# Patient Record
Sex: Female | Born: 2000 | Race: White | Hispanic: No | Marital: Single
Health system: Southern US, Community
[De-identification: ages and names within clinical notes are randomized; demographics above are authoritative.]

## PROBLEM LIST (undated history)

## (undated) DIAGNOSIS — S63642D Sprain of metacarpophalangeal joint of left thumb, subsequent encounter: Secondary | ICD-10-CM

## (undated) DIAGNOSIS — Z30431 Encounter for routine checking of intrauterine contraceptive device: Secondary | ICD-10-CM

## (undated) DIAGNOSIS — M65842 Other synovitis and tenosynovitis, left hand: Secondary | ICD-10-CM

## (undated) DIAGNOSIS — N840 Polyp of corpus uteri: Secondary | ICD-10-CM

---
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
## 2019-02-25 NOTE — Telephone Encounter (Signed)
Love, Cyndia Diver, MD  Jashon Ishida, Drenda Freeze, RN    Caller: Unspecified (Today, ??2:21 PM)       ??       Please reassure her that is normal - she may be amenorrheic with the IUD or she can get occasional irregular bleeding.      This nurse attempted to reach the patient and left a detailed message for the patient regarding MD recommendations and to cal the office back prn

## 2019-02-25 NOTE — Telephone Encounter (Signed)
Call received at 2:35Pm,      18 year old patient last seen in the office on on 04/22/2018.  Patient has mirena iud inserted on 08/2016.    Patient calling to say that she has not had a period or cramping since having the iud.  Patient reports bleeding starting today and it is clot and she has not changed her tampon/pad yet since she just woke up at 12 noon.    Patient report feeing the strings and is not pregnant ( not sex since last office visit)  Patient reports cramping at 4 on the pain scale of 1-10.    Patient wondering how to proceed  Please advise

## 2019-06-13 ENCOUNTER — Encounter: Attending: Obstetrics & Gynecology | Primary: Family Medicine

## 2019-06-13 NOTE — Progress Notes (Deleted)
Annual exam    Bethany Clark is a 19 y.o. presenting for annual well woman exam. Her main concerns today include    She declines / accepts a chaperone during the gynecologic exam today.     Ob/Gyn Hx:  G P  -   LMP -  Menses -   Contraception -  STI -   SA -    Health maintenance:  Gardasil -      Past Medical History:   Diagnosis Date   ??? Abnormal bleeding in menstrual cycle    ??? Encounter for IUD insertion 08/2016    Mirena place by Dr. Kenton Kingfisher   ??? Impetigo 04/21/2018    face   ??? Irregular menses        Past Surgical History:   Procedure Laterality Date   ??? HX DILATION AND CURETTAGE  06/2016   ??? HX HEENT      Wisdom teeth       Family History   Problem Relation Age of Onset   ??? Other Mother         ablation   ??? No Known Problems Father    ??? Diabetes Paternal Grandfather    ??? Pacemaker Maternal Uncle    ??? Cancer Other         leukemia       Social History     Socioeconomic History   ??? Marital status: SINGLE     Spouse name: Not on file   ??? Number of children: Not on file   ??? Years of education: Not on file   ??? Highest education level: Not on file   Occupational History   ??? Not on file   Social Needs   ??? Financial resource strain: Not on file   ??? Food insecurity     Worry: Not on file     Inability: Not on file   ??? Transportation needs     Medical: Not on file     Non-medical: Not on file   Tobacco Use   ??? Smoking status: Never Smoker   ??? Smokeless tobacco: Never Used   Substance and Sexual Activity   ??? Alcohol use: Never     Frequency: Never   ??? Drug use: Never   ??? Sexual activity: Yes     Partners: Male, Female     Birth control/protection: I.U.D.     Comment: Mirena  placed 2018   Lifestyle   ??? Physical activity     Days per week: Not on file     Minutes per session: Not on file   ??? Stress: Not on file   Relationships   ??? Social Wellsite geologist on phone: Not on file     Gets together: Not on file     Attends religious service: Not on file     Active member of club or organization: Not on file      Attends meetings of clubs or organizations: Not on file     Relationship status: Not on file   ??? Intimate partner violence     Fear of current or ex partner: Not on file     Emotionally abused: Not on file     Physically abused: Not on file     Forced sexual activity: Not on file   Other Topics Concern   ??? Not on file   Social History Narrative    ** Merged History Encounter **  Current Outpatient Medications   Medication Sig Dispense Refill   ??? fluconazole (DIFLUCAN) 150 mg tablet Take one tablet now and repeat in one week 2 Tab 2   ??? mupirocin (BACTROBAN) 2 % ointment      ??? oxyCODONE-acetaminophen (PERCOCET) 5-325 mg per tablet Take 1 Tab by mouth every six (6) hours as needed for Pain. Max Daily Amount: 4 Tabs. 15 Tab 0   ??? ibuprofen (MOTRIN) 600 mg tablet Take 1 Tab by mouth every six (6) hours as needed for Pain. (Patient not taking: Reported on 05/24/2018) 40 Tab 2   ??? norethindrone-e estradiol-iron (BLISOVI 24 FE) 1 mg-20 mcg (24)/75 mg (4) tab Take 1 Tab by mouth daily.     ??? ferrous sulfate (SLOW FE) 142 mg (45 mg iron) ER tablet Take 142 mg by mouth two (2) times a day.     ??? B.infantis-B.ani-B.long-B.bifi (PROBIOTIC 4X) 10-15 mg TbEC Take 1 Tab by mouth daily.         No Known Allergies    Review of Systems - History obtained from the patient  Constitutional: negative for weight loss, fever, night sweats  HEENT: negative for hearing loss, earache, congestion, snoring, sorethroat  CV: negative for chest pain, palpitations, edema  Resp: negative for cough, shortness of breath, wheezing  GI: negative for change in bowel habits, abdominal pain, black or bloody stools  GU: negative for frequency, dysuria, hematuria, vaginal discharge  MSK: negative for back pain, joint pain, muscle pain  Breast: negative for breast lumps, nipple discharge, galactorrhea  Skin :negative for itching, rash, hives  Neuro: negative for dizziness, headache, confusion, weakness   Psych: negative for anxiety, depression, change in mood  Heme/lymph: negative for bleeding, bruising, pallor    Physical Exam    There were no vitals taken for this visit.    Constitutional  ?? Appearance: well-nourished, well developed, alert, in no acute distress    HENT  ?? Head and Face: appears normal    Neck  ?? Inspection/Palpation: normal appearance, no masses or tenderness  ?? Lymph Nodes: no lymphadenopathy present  ?? Thyroid: gland size normal, nontender, no nodules or masses present on palpation    Chest  ?? Respiratory Effort: non-labored breathing  ?? Auscultation: CTAB, normal breath sounds    Cardiovascular  ?? Heart:  ?? Auscultation: regular rate and rhythm without murmur  ?? Extremities: no peripheral edema    Gastrointestinal  ?? Abdominal Examination: abdomen non-tender to palpation, normal bowel sounds, no masses present  ?? Liver and spleen: no hepatomegaly present, spleen not palpable  ?? Hernias: no hernias identified    Genitourinary  ?? External Genitalia: normal appearance for age, no discharge present, no tenderness present, no inflammatory lesions present, no masses present, no atrophy present  ?? Vagina: normal vaginal vault without central or paravaginal defects, no discharge present, no inflammatory lesions present, no masses present  ?? Bladder: non-tender to palpation  ?? Urethra: appears normal  ?? Cervix: normal   ?? Uterus: normal size, shape and consistency  ?? Adnexa: no adnexal tenderness present, no adnexal masses present  ?? Perineum: perineum within normal limits, no evidence of trauma, no rashes or skin lesions present    Skin  ?? General Inspection: no rash, no lesions identified    Neurologic/Psychiatric  ?? Mental Status:  ?? Orientation: grossly oriented to person, place and time  ?? Mood and Affect: mood normal, affect appropriate      Assessment/Plan:  19 y.o. presenting for annual  exam. Overall doing well.     Well woman exam:  Normal gynecologic and physical exams.    Healthy habits and lifestyle and safe sex practices reviewed.   Pap indicated at 20yo.  Patient accepts STD screening.  Counseled regarding HPV vaccination.  Contraception and menstrual regulation -        Christoper Allegra, MD

## 2019-07-17 NOTE — Telephone Encounter (Signed)
Call received at 16:58am    19 year old patient last seen in the office on 05/24/2018 and has up coming appointment on 08/08/2019    Patient wondering about needing to replace her iud    Patient was advised that according to her chart notes by Md her iud was inserted March 2018.    Patient was advised IUD placement ok for 5 years    Patient advised to discuss her questions about the IUD replace ment at her next appointment    Patient verbalized understanding.

## 2019-08-08 ENCOUNTER — Ambulatory Visit
Admit: 2019-08-08 | Discharge: 2019-08-08 | Payer: TRICARE (CHAMPUS) | Attending: Obstetrics & Gynecology | Primary: Family Medicine

## 2019-08-08 ENCOUNTER — Ambulatory Visit: Attending: Obstetrics & Gynecology

## 2019-08-08 DIAGNOSIS — Z01419 Encounter for gynecological examination (general) (routine) without abnormal findings: Secondary | ICD-10-CM

## 2019-08-08 MED ORDER — BUSPIRONE 5 MG TAB
5 mg | ORAL_TABLET | Freq: Two times a day (BID) | ORAL | 1 refills | Status: AC
Start: 2019-08-08 — End: ?

## 2019-08-08 NOTE — Patient Instructions (Signed)
Well Visit, Ages 18 to 50: Care Instructions  Your Care Instructions     Physical exams can help you stay healthy. Your doctor has checked your overall health and may have suggested ways to take good care of yourself. He or she also may have recommended tests. At home, you can help prevent illness with healthy eating, regular exercise, and other steps.  Follow-up care is a key part of your treatment and safety. Be sure to make and go to all appointments, and call your doctor if you are having problems. It's also a good idea to know your test results and keep a list of the medicines you take.  How can you care for yourself at home?  ?? Reach and stay at a healthy weight. This will lower your risk for many problems, such as obesity, diabetes, heart disease, and high blood pressure.  ?? Get at least 30 minutes of physical activity on most days of the week. Walking is a good choice. You also may want to do other activities, such as running, swimming, cycling, or playing tennis or team sports. Discuss any changes in your exercise program with your doctor.  ?? Do not smoke or allow others to smoke around you. If you need help quitting, talk to your doctor about stop-smoking programs and medicines. These can increase your chances of quitting for good.  ?? Talk to your doctor about whether you have any risk factors for sexually transmitted infections (STIs). Having one sex partner (who does not have STIs and does not have sex with anyone else) is a good way to avoid these infections.  ?? Use birth control if you do not want to have children at this time. Talk with your doctor about the choices available and what might be best for you.  ?? Protect your skin from too much sun. When you're outdoors from 10 a.m. to 4 p.m., stay in the shade or cover up with clothing and a hat with a wide brim. Wear sunglasses that block UV rays. Even when it's cloudy, put broad-spectrum sunscreen (SPF 30 or higher) on any exposed skin.   ?? See a dentist one or two times a year for checkups and to have your teeth cleaned.  ?? Wear a seat belt in the car.  Follow your doctor's advice about when to have certain tests. These tests can spot problems early.  For everyone  ?? Cholesterol. Have the fat (cholesterol) in your blood tested after age 20. Your doctor will tell you how often to have this done based on your age, family history, or other things that can increase your risk for heart disease.  ?? Blood pressure. Have your blood pressure checked during a routine doctor visit. Your doctor will tell you how often to check your blood pressure based on your age, your blood pressure results, and other factors.  ?? Vision. Talk with your doctor about how often to have a glaucoma test.  ?? Diabetes. Ask your doctor whether you should have tests for diabetes.  ?? Colon cancer. Your risk for colorectal cancer gets higher as you get older. Some experts say that adults should start regular screening at age 50 and stop at age 75. Others say to start before age 50 or continue after age 75. Talk with your doctor about your risk and when to start and stop screening.  For women  ?? Breast exam and mammogram. Talk to your doctor about when you should have a clinical breast exam and   a mammogram. Medical experts differ on whether and how often women under 50 should have these tests. Your doctor can help you decide what is right for you.  ?? Cervical cancer screening test and pelvic exam. Begin with a Pap test at age 21. The test often is part of a pelvic exam. Starting at age 30, you may choose to have a Pap test, an HPV test, or both tests at the same time (called co-testing). Talk with your doctor about how often to have testing.  ?? Tests for sexually transmitted infections (STIs). Ask whether you should have tests for STIs. You may be at risk if you have sex with more than one person, especially if your partners do not wear condoms.  For men   ?? Tests for sexually transmitted infections (STIs). Ask whether you should have tests for STIs. You may be at risk if you have sex with more than one person, especially if you do not wear a condom.  ?? Testicular cancer exam. Ask your doctor whether you should check your testicles regularly.  ?? Prostate exam. Talk to your doctor about whether you should have a blood test (called a PSA test) for prostate cancer. Experts differ on whether and when men should have this test. Some experts suggest it if you are older than 45 and are African-American or have a father or brother who got prostate cancer when he was younger than 65.  When should you call for help?  Watch closely for changes in your health, and be sure to contact your doctor if you have any problems or symptoms that concern you.  Where can you learn more?  Go to https://www.healthwise.net/[facility]  Enter P072 in the search box to learn more about "Well Visit, Ages 18 to 50: Care Instructions."  Current as of: Oct 30, 2018??????????????????????????????Content Version: 12.6  ?? 2006-2020 Healthwise[employer].   Care instructions adapted under license by [employer] (which disclaims liability or warranty for this information). If you have questions about a medical condition or this instruction, always ask your healthcare professional. Healthwise, Incorporated disclaims any warranty or liability for your use of this information.

## 2019-08-08 NOTE — Progress Notes (Signed)
Annual exam    Bethany Clark is a 19 y.o. presenting for annual well woman exam. Her main concerns today include recurrent yeast infections and concerns about her hormone levels.    She feels like she is depressed and is having anxiety attacks.  She tried seeing a therapist through student health at [employer], but felt there therapist was judgmental and did not go back. She does not have a car at college this year, and so has not been able to see a provider outside of campus.     She reports having multiple yeast infections over the past year, states none of the medications or treatments she has tried have been effective. She is also concerned that her Mirena may be causing problems with her hormone levels. She has not had the yeast formally diagnosed.     She reports having low energy, feeling tired all the time. Of note, she did have Covid last August shortly after returning to college.     She attends [employer].    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     She declines a chaperone during the gynecologic exam today.     Ob/Gyn Hx:  G0  LMP - n/a  Menses - none w/ Mirena  Contraception - Mirena placed March 2018  STI - last screening negative 04/22/18  SA - not currently    Health maintenance:  Gardasil -      Past Medical History:   Diagnosis Date   ??? Abnormal bleeding in menstrual cycle    ??? Encounter for IUD insertion 08/2016    Mirena place by Dr. Ronnell Freshwater   ??? Impetigo 04/21/2018    face   ??? Irregular menses        Past Surgical History:   Procedure Laterality Date   ??? HX DILATION AND CURETTAGE  06/2016   ??? HX HEENT      Wisdom teeth   ??? HX ORTHOPAEDIC         Family History   Problem Relation Age of Onset   ??? Other Mother         ablation   ??? No Known Problems Father    ??? Diabetes Paternal Grandfather    ??? Pacemaker Maternal Uncle    ??? Cancer Other         leukemia       Social History     Socioeconomic History    ??? Marital status: SINGLE     Spouse name: Not on file   ??? Number of children: Not on file   ??? Years of education: Not on file   ??? Highest education level: Not on file   Occupational History   ??? Not on file   Social Needs   ??? Financial resource strain: Not on file   ??? Food insecurity     Worry: Not on file     Inability: Not on file   ??? Transportation needs     Medical: Not on file     Non-medical: Not on file   Tobacco Use   ??? Smoking status: Never Smoker   ??? Smokeless tobacco: Never Used   Substance and Sexual Activity   ??? Alcohol use: Never     Frequency: Never   ??? Drug use: Never   ??? Sexual activity: Not Currently     Partners: Male, Female     Birth control/protection: I.U.D.  Comment: Mirena  placed 2018   Lifestyle   ??? Physical activity     Days per week: Not on file     Minutes per session: Not on file   ??? Stress: Not on file   Relationships   ??? Social Wellsite geologist on phone: Not on file     Gets together: Not on file     Attends religious service: Not on file     Active member of club or organization: Not on file     Attends meetings of clubs or organizations: Not on file     Relationship status: Not on file   ??? Intimate partner violence     Fear of current or ex partner: Not on file     Emotionally abused: Not on file     Physically abused: Not on file     Forced sexual activity: Not on file   Other Topics Concern   ??? Not on file   Social History Narrative    ** Merged History Encounter **            Current Outpatient Medications   Medication Sig Dispense Refill   ??? ibuprofen (MOTRIN) 600 mg tablet Take 1 Tab by mouth every six (6) hours as needed for Pain. (Patient not taking: Reported on 05/24/2018) 40 Tab 2       No Known Allergies    Review of Systems - History obtained from the patient  Constitutional: negative for weight loss, fever, night sweats  HEENT: negative for hearing loss, earache, congestion, snoring, sorethroat  CV: negative for chest pain, palpitations, edema   Resp: negative for cough, shortness of breath, wheezing  GI: negative for change in bowel habits, abdominal pain, black or bloody stools  GU: negative for frequency, dysuria, hematuria, vaginal discharge  MSK: negative for back pain, joint pain, muscle pain  Breast: negative for breast lumps, nipple discharge, galactorrhea  Skin :negative for itching, rash, hives  Neuro: negative for dizziness, headache, confusion, weakness  Psych: negative for anxiety, depression, change in mood  Heme/lymph: negative for bleeding, bruising, pallor    Physical Exam    Visit Vitals  BP 130/84 (BP 1 Location: Left arm, BP Patient Position: Sitting)   Ht 6' (1.829 m)   Wt 180 lb (81.6 kg)   BMI 24.41 kg/m??       Constitutional  ?? Appearance: well-nourished, well developed, alert, in no acute distress    HENT  ?? Head and Face: appears normal    Neck  ?? Inspection/Palpation: normal appearance, no masses or tenderness  ?? Lymph Nodes: no lymphadenopathy present  ?? Thyroid: gland size normal, nontender, no nodules or masses present on palpation    Chest  ?? Respiratory Effort: non-labored breathing  ?? Auscultation: CTAB, normal breath sounds    Cardiovascular  ?? Heart:  ?? Auscultation: regular rate and rhythm without murmur  ?? Extremities: no peripheral edema    Gastrointestinal  ?? Abdominal Examination: abdomen non-tender to palpation, normal bowel sounds, no masses present  ?? Liver and spleen: no hepatomegaly present, spleen not palpable  ?? Hernias: no hernias identified    Genitourinary  ?? External Genitalia: normal appearance for age, no discharge present, no tenderness present, no inflammatory lesions present, no masses present, no atrophy present  ?? Vagina: normal vaginal vault without central or paravaginal defects, no discharge present, no inflammatory lesions present, no masses present  ?? Bladder: non-tender to palpation  ?? Urethra: appears  normal  ?? Cervix: normal IUD strings 2cm  ?? Uterus: normal size, shape and consistency   ?? Adnexa: no adnexal tenderness present, no adnexal masses present  ?? Perineum: perineum within normal limits, no evidence of trauma, no rashes or skin lesions present    Skin  ?? General Inspection: no rash, no lesions identified    Neurologic/Psychiatric  ?? Mental Status:  ?? Orientation: grossly oriented to person, place and time  ?? Mood and Affect: mood normal, affect appropriate      Assessment/Plan:  19 y.o. presenting for annual exam. Overall doing well.     Well woman exam:  Normal gynecologic and physical exams.   Healthy habits and lifestyle and safe sex practices reviewed.   Pap indicated at 20yo.  Full nuswab including STD screening, BV, snc 6 species yeast given her recurrent vaginal symptoms with no improvement with management options thus far.  Contraception and menstrual regulation -  Cont Mirena IUD.    Anxiety/depression - discussed at length, recommended seeing a mental health care professional and names given. Discussed med options, and she accepts trial of buspar. Rx buspar sent.       Christoper Allegra, MD

## 2019-08-08 NOTE — Progress Notes (Signed)
Left message requesting patient call office to review results of her recent lab tests.

## 2019-08-08 NOTE — Progress Notes (Signed)
Please call pt and let her know that her pelvic swab was positive for candida albicans, but negative for everything else. This is the most common type of yeast, and is often a yeast that women are colonized with.   If she is having itching or irritation, please send in rx for terconazole 7 day for her.

## 2019-08-08 NOTE — Progress Notes (Signed)
Please call pt and let her know that her pelvic swab was positive for candida albicans, but negative for everything else. This is the most common type of yeast, and is often a yeast that women are colonized with.   If she is having itching or irritation, please send in rx for terconazole 7 day for her.

## 2019-08-08 NOTE — Progress Notes (Signed)
Annual exam    Bethany Clark is a 19 y.o. presenting for annual well woman exam. Her main concerns today include recurrent yeast infections and concerns about her hormone levels.    She feels like she is depressed and is having anxiety attacks.  She tried seeing a therapist through student health at [place], but felt there therapist was judgmental and did not go back. She does not have a car at college this year, and so has not been able to see a provider outside of campus.     She reports having multiple yeast infections over the past year, states none of the medications or treatments she has tried have been effective. She is also concerned that her Mirena may be causing problems with her hormone levels. She has not had the yeast formally diagnosed.     She reports having low energy, feeling tired all the time. Of note, she did have Covid last August shortly after returning to college.     She attends [employer].    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     She declines a chaperone during the gynecologic exam today.     Ob/Gyn Hx:  G0  LMP - n/a  Menses - none w/ Mirena  Contraception - Mirena placed March 2018  STI - last screening negative 04/22/18  SA - not currently    Health maintenance:  Gardasil -      Past Medical History:   Diagnosis Date   ??? Abnormal bleeding in menstrual cycle    ??? Encounter for IUD insertion 08/2016    Mirena place by Dr. Kenton Kingfisher   ??? Impetigo 04/21/2018    face   ??? Irregular menses        Past Surgical History:   Procedure Laterality Date   ??? HX DILATION AND CURETTAGE  06/2016   ??? HX HEENT      Wisdom teeth   ??? HX ORTHOPAEDIC         Family History   Problem Relation Age of Onset   ??? Other Mother         ablation   ??? No Known Problems Father    ??? Diabetes Paternal Grandfather    ??? Pacemaker Maternal Uncle    ??? Cancer Other         leukemia       Social History     Socioeconomic History   ???  Marital status: SINGLE     Spouse name: Not on file   ??? Number of children: Not on file   ??? Years of education: Not on file   ??? Highest education level: Not on file   Occupational History   ??? Not on file   Social Needs   ??? Financial resource strain: Not on file   ??? Food insecurity     Worry: Not on file     Inability: Not on file   ??? Transportation needs     Medical: Not on file     Non-medical: Not on file   Tobacco Use   ??? Smoking status: Never Smoker   ??? Smokeless tobacco: Never Used   Substance and Sexual Activity   ??? Alcohol use: Never     Frequency: Never   ??? Drug use: Never   ??? Sexual activity: Not Currently     Partners: Male, Female     Birth control/protection: I.U.D.  Comment: Mirena  placed 2018   Lifestyle   ??? Physical activity     Days per week: Not on file     Minutes per session: Not on file   ??? Stress: Not on file   Relationships   ??? Social Wellsite geologist on phone: Not on file     Gets together: Not on file     Attends religious service: Not on file     Active member of club or organization: Not on file     Attends meetings of clubs or organizations: Not on file     Relationship status: Not on file   ??? Intimate partner violence     Fear of current or ex partner: Not on file     Emotionally abused: Not on file     Physically abused: Not on file     Forced sexual activity: Not on file   Other Topics Concern   ??? Not on file   Social History Narrative    ** Merged History Encounter **            Current Outpatient Medications   Medication Sig Dispense Refill   ??? ibuprofen (MOTRIN) 600 mg tablet Take 1 Tab by mouth every six (6) hours as needed for Pain. (Patient not taking: Reported on 05/24/2018) 40 Tab 2       No Known Allergies    Review of Systems - History obtained from the patient  Constitutional: negative for weight loss, fever, night sweats  HEENT: negative for hearing loss, earache, congestion, snoring, sorethroat  CV: negative for chest pain, palpitations, edema  Resp: negative for  cough, shortness of breath, wheezing  GI: negative for change in bowel habits, abdominal pain, black or bloody stools  GU: negative for frequency, dysuria, hematuria, vaginal discharge  MSK: negative for back pain, joint pain, muscle pain  Breast: negative for breast lumps, nipple discharge, galactorrhea  Skin :negative for itching, rash, hives  Neuro: negative for dizziness, headache, confusion, weakness  Psych: negative for anxiety, depression, change in mood  Heme/lymph: negative for bleeding, bruising, pallor    Physical Exam    Visit Vitals  BP 130/84 (BP 1 Location: Left arm, BP Patient Position: Sitting)   Ht 6' (1.829 m)   Wt 180 lb (81.6 kg)   BMI 24.41 kg/m??       Constitutional  ?? Appearance: well-nourished, well developed, alert, in no acute distress    HENT  ?? Head and Face: appears normal    Neck  ?? Inspection/Palpation: normal appearance, no masses or tenderness  ?? Lymph Nodes: no lymphadenopathy present  ?? Thyroid: gland size normal, nontender, no nodules or masses present on palpation    Chest  ?? Respiratory Effort: non-labored breathing  ?? Auscultation: CTAB, normal breath sounds    Cardiovascular  ?? Heart:  ?? Auscultation: regular rate and rhythm without murmur  ?? Extremities: no peripheral edema    Gastrointestinal  ?? Abdominal Examination: abdomen non-tender to palpation, normal bowel sounds, no masses present  ?? Liver and spleen: no hepatomegaly present, spleen not palpable  ?? Hernias: no hernias identified    Genitourinary  ?? External Genitalia: normal appearance for age, no discharge present, no tenderness present, no inflammatory lesions present, no masses present, no atrophy present  ?? Vagina: normal vaginal vault without central or paravaginal defects, no discharge present, no inflammatory lesions present, no masses present  ?? Bladder: non-tender to palpation  ?? Urethra: appears  normal  ?? Cervix: normal IUD strings 2cm  ?? Uterus: normal size, shape and consistency  ?? Adnexa: no adnexal  tenderness present, no adnexal masses present  ?? Perineum: perineum within normal limits, no evidence of trauma, no rashes or skin lesions present    Skin  ?? General Inspection: no rash, no lesions identified    Neurologic/Psychiatric  ?? Mental Status:  ?? Orientation: grossly oriented to person, place and time  ?? Mood and Affect: mood normal, affect appropriate      Assessment/Plan:  19 y.o. presenting for annual exam. Overall doing well.     Well woman exam:  Normal gynecologic and physical exams.   Healthy habits and lifestyle and safe sex practices reviewed.   Pap indicated at 20yo.  Full nuswab including STD screening, BV, snc 6 species yeast given her recurrent vaginal symptoms with no improvement with management options thus far.  Contraception and menstrual regulation -  Cont Mirena IUD.    Anxiety/depression - discussed at length, recommended seeing a mental health care professional and names given. Discussed med options, and she accepts trial of buspar. Rx buspar sent.       Gevena Cotton, MD

## 2019-08-08 NOTE — Progress Notes (Signed)
Left message requesting patient call office to review results of her recent lab tests.

## 2019-08-12 LAB — NUSWAB VAGINITIS PLUS (VG+) WITH CANDIDA (SIX SPECIES)
C PARAPSILOSIS/TROPICALIS: NEGATIVE
C PARAPSILOSIS/TROPICALIS: NEGATIVE
C. albicans, NAA: POSITIVE — AB
C. glabrata, NAA: NEGATIVE
C. trachomatis, NAA: NEGATIVE
CANDIDA KRUSEI, NAA, 180016: NEGATIVE
Candida Lusitaniae, NAA: NEGATIVE
Candida albicans, NAA: POSITIVE — AB
Candida glabrata by PCR: NEGATIVE
Candida krusei: NEGATIVE
Candida lusitaniae: NEGATIVE
Chlamydia trachomatis, NAA: NEGATIVE
N. gonorrhoeae, NAA: NEGATIVE
Neisseria Gonorrhoeae, NAA: NEGATIVE
T. vaginalis, NAA: NEGATIVE
Trichomonas Vaginalis by NAA: NEGATIVE

## 2019-08-12 LAB — SPECIMEN STATUS REPORT

## 2019-08-14 MED ORDER — TERCONAZOLE 0.4 % VAGINAL CREAM
0.4 % | Freq: Every evening | VAGINAL | 0 refills | Status: AC
Start: 2019-08-14 — End: 2019-08-21

## 2019-08-14 NOTE — Telephone Encounter (Signed)
Patient is calling for lab results:        Please call pt and let her know that her pelvic swab was positive for candida albicans, but negative for everything else. This is the most common type of yeast, and is often a yeast that women are colonized with.   If she is having itching or irritation, please send in rx for terconazole 7 day for her    Patient request change of pharmacy:      [facility] [address]  [place], [place] [address]    [phone]

## 2019-08-14 NOTE — Telephone Encounter (Signed)
rx has been sent for Terazol 7.

## 2019-09-08 ENCOUNTER — Encounter: Attending: Obstetrics & Gynecology | Primary: Family Medicine

## 2019-09-11 ENCOUNTER — Ambulatory Visit: Payer: TRICARE (CHAMPUS) | Attending: Obstetrics & Gynecology | Primary: Family Medicine

## 2019-10-02 ENCOUNTER — Encounter: Payer: TRICARE (CHAMPUS) | Attending: Obstetrics & Gynecology | Primary: Family Medicine

## 2019-10-02 MED ORDER — TERCONAZOLE 0.4 % VAGINAL CREAM
0.4 % | Freq: Every evening | VAGINAL | 1 refills | Status: AC
Start: 2019-10-02 — End: 2019-10-09

## 2019-10-02 NOTE — Progress Notes (Deleted)
Problem Visit    Bethany Clark is a 19 y.o.  presenting for problem visit. Her main concern today is follow up for anxiety. At her annual exam in March she was started on a trial of Buspar 5mg  for anxiety/depression.      Ob/Gyn Hx:  G0  LMP - n/a  Menses - none w/ Mirena  Contraception - Mirena placed March 2018  SA - not currently      Past Medical History:   Diagnosis Date   ??? Abnormal bleeding in menstrual cycle    ??? Encounter for IUD insertion 08/2016    Mirena place by Dr. Ronnell Freshwater   ??? Impetigo 04/21/2018    face   ??? Irregular menses        Past Surgical History:   Procedure Laterality Date   ??? HX DILATION AND CURETTAGE  06/2016   ??? HX HEENT      Wisdom teeth   ??? HX ORTHOPAEDIC         Family History   Problem Relation Age of Onset   ??? Other Mother         ablation   ??? No Known Problems Father    ??? Diabetes Paternal Grandfather    ??? Pacemaker Maternal Uncle    ??? Cancer Other         leukemia       Social History     Socioeconomic History   ??? Marital status: SINGLE     Spouse name: Not on file   ??? Number of children: Not on file   ??? Years of education: Not on file   ??? Highest education level: Not on file   Occupational History   ??? Not on file   Social Needs   ??? Financial resource strain: Not on file   ??? Food insecurity     Worry: Not on file     Inability: Not on file   ??? Transportation needs     Medical: Not on file     Non-medical: Not on file   Tobacco Use   ??? Smoking status: Never Smoker   ??? Smokeless tobacco: Never Used   Substance and Sexual Activity   ??? Alcohol use: Never     Frequency: Never   ??? Drug use: Never   ??? Sexual activity: Not Currently     Partners: Male, Female     Birth control/protection: I.U.D.     Comment: Mirena  placed 2018   Lifestyle   ??? Physical activity     Days per week: Not on file     Minutes per session: Not on file   ??? Stress: Not on file   Relationships   ??? Social Product manager on phone: Not on file     Gets together: Not on file     Attends religious service: Not on  file     Active member of club or organization: Not on file     Attends meetings of clubs or organizations: Not on file     Relationship status: Not on file   ??? Intimate partner violence     Fear of current or ex partner: Not on file     Emotionally abused: Not on file     Physically abused: Not on file     Forced sexual activity: Not on file   Other Topics Concern   ??? Not on file   Social History Narrative    **  Merged History Encounter **            Current Outpatient Medications   Medication Sig Dispense Refill   ??? busPIRone (BUSPAR) 5 mg tablet Take 1 Tab by mouth two (2) times a day. 60 Tab 1   ??? ibuprofen (MOTRIN) 600 mg tablet Take 1 Tab by mouth every six (6) hours as needed for Pain. (Patient not taking: Reported on 05/24/2018) 40 Tab 2       No Known Allergies    Review of Systems - History obtained from the patient  Constitutional: negative for weight loss, fever, night sweats  HEENT: negative for hearing loss, earache, congestion, snoring, sorethroat  CV: negative for chest pain, palpitations, edema  Resp: negative for cough, shortness of breath, wheezing  GI: negative for change in bowel habits, abdominal pain, black or bloody stools  GU: negative for frequency, dysuria, hematuria, vaginal discharge  MSK: negative for back pain, joint pain, muscle pain  Breast: negative for breast lumps, nipple discharge, galactorrhea  Skin :negative for itching, rash, hives  Neuro: negative for dizziness, headache, confusion, weakness  Psych: negative for anxiety, depression, change in mood  Heme/lymph: negative for bleeding, bruising, pallor    Physical Exam    There were no vitals taken for this visit.      OBGyn Exam      Constitutional  ?? Appearance: well-nourished, well developed, alert, in no acute distress    HENT  ?? Head and Face: appears normal    Neck  ?? Inspection/Palpation: normal appearance, no masses or tenderness  ?? Thyroid: gland size normal, nontender    Chest  ?? Respiratory Effort: non-labored  breathing    Cardiovascular  ?? Extremities: no peripheral edema    Gastrointestinal  ?? Abdominal Examination: abdomen non-distended, non-tender to palpation, no masses present  ?? Liver and spleen: no hepatomegaly present, spleen not palpable  ?? Hernias: no hernias identified    Genitourinary  ?? External Genitalia: normal appearance for age, no discharge present, no tenderness present, no inflammatory lesions present, no masses present, no atrophy present  ?? Vagina: normal vaginal vault without central or paravaginal defects, no discharge present, no inflammatory lesions present, no masses present  ?? Bladder: non-tender to palpation  ?? Urethra: appears normal  ?? Cervix: normal   ?? Uterus: normal size, shape and consistency  ?? Adnexa: no adnexal tenderness present, no adnexal masses present  ?? Perineum: perineum within normal limits, no evidence of trauma, no rashes or skin lesions present    Skin  ?? General Inspection: no rash, no lesions identified    Neurologic/Psychiatric  ?? Mental Status:  ?? Orientation: grossly oriented to person, place and time  ?? Mood and Affect: mood normal, affect appropriate      Assessment/Plan:    There are no diagnoses linked to this encounter.        Christoper Allegra, MD

## 2019-10-02 NOTE — Telephone Encounter (Signed)
Love, Cyndia Diver, MD  Arn Medal, LPN   Caller: Unspecified (Today, ??1:48 PM)      ??      No problem, will you send in rx for terazole 7 with 1 refill to her pharmacy?    Previous Messages

## 2019-10-02 NOTE — Telephone Encounter (Signed)
Patient of SP    Calling to say that she has been getting recurrent yeast infections for about 1 year now.  She said she uses Monistat 7 and it does not help.  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  She said it went away and has come back.      She had an appointment for today set up and had car trouble.  She can not make it this week.  Would like to see if RL would send her in something.  She said she still has a yeasty smell in vaginal area, itching, and burning/itch.  No concerns of STD's per patient.      [facility] [address].

## 2019-10-02 NOTE — Telephone Encounter (Signed)
rx has been sent and patient advised.

## 2019-11-12 ENCOUNTER — Ambulatory Visit: Attending: Obstetrics & Gynecology | Primary: Family Medicine

## 2019-12-13 IMAGING — DX DG TOE GREAT 2+V*R*
3 series · 3 of 3 positions shown · non-contrast
Comparison: None.

CLINICAL DATA: Injured great toe playing basketball

EXAM:
RIGHT GREAT TOE

[toe ap]
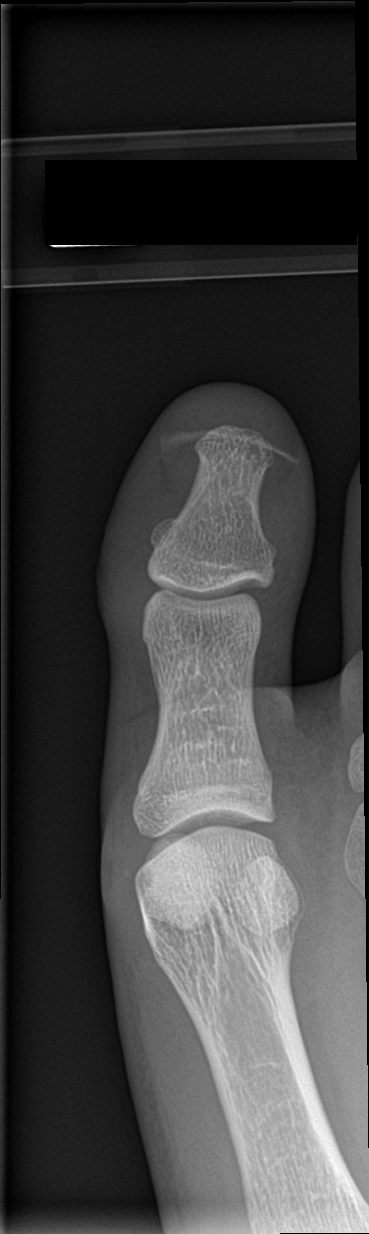

[toe obl]
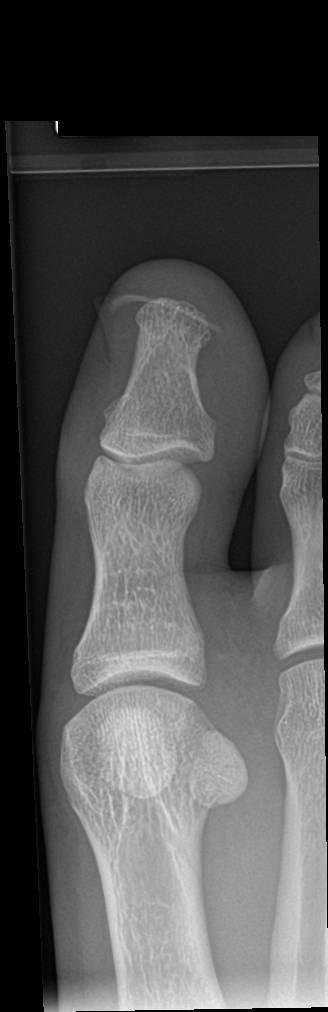

[toe lat]
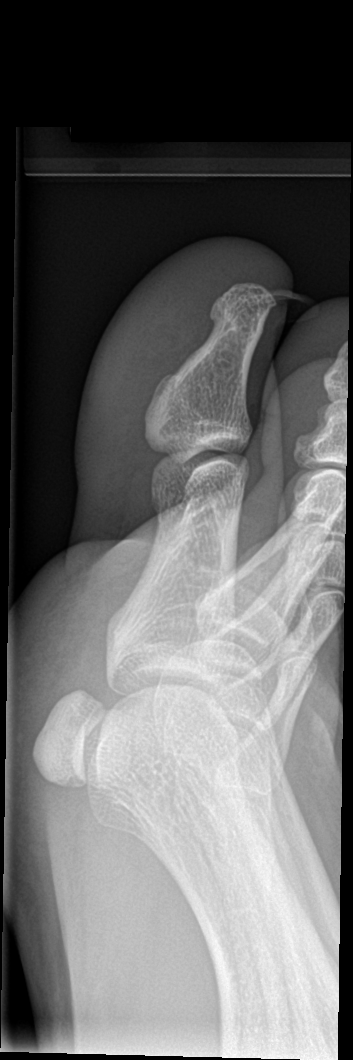

[3 of 3 positions shown; findings below may reference images not displayed]

FINDINGS: There is no evidence of fracture or dislocation. There is no
evidence of arthropathy or other focal bone abnormality. Soft
tissues are unremarkable.
IMPRESSION: Negative.

## 2020-06-23 NOTE — Telephone Encounter (Signed)
Call received at 12:70nn      20 year old patient last seen in the office on 08/08/2019    Patient has iud inserted 3 /2018 by Dr Talbert Cage      Patient calling to say that she can not feel the strings any more     Patient denies vaginal bleeding or cramping    Patient has appointment on 07/16/2020 for iud check but is wondering if she need ultrasound to check placement    Patient is concerned because now she is using for birth control and wants to make sure it is working    Please advise    Thank you    RL patient

## 2020-06-23 NOTE — Telephone Encounter (Signed)
Telephone Encounter by Raleigh Lions, RN at 06/23/20 1332                Author: Abbey Chatters, Drenda Freeze, RN  Service: --  Author Type: Registered Nurse       Filed: 06/23/20 1348  Encounter Date: 06/23/2020  Status: Signed          Editor: Smeltzer, Drenda Freeze, RN (Registered Nurse)               ice Only (iud)        Rob Bunting, MD    You 15 minutes ago (1:16 PM)        AH        Please add [place] to 2/11 appointment; thanks; can use condoms til appt         Patient was advised of MD recommendations and patient was rescheduled to include ultrasound      Patient was placed on the schedule on 07/23/2020 at 12:30PM ( arrive at 12:15Pm)and then 1;10PM to see provider         Patient verbalized understanding.

## 2020-07-16 ENCOUNTER — Encounter: Attending: Advanced Practice Midwife | Primary: Family Medicine

## 2020-07-23 ENCOUNTER — Encounter: Attending: Obstetrics & Gynecology | Primary: Family Medicine

## 2020-07-23 ENCOUNTER — Encounter

## 2020-07-23 ENCOUNTER — Encounter: Admit: 2020-07-23 | Payer: TRICARE (CHAMPUS) | Primary: Family Medicine

## 2020-07-23 ENCOUNTER — Encounter: Primary: Family Medicine

## 2020-07-23 DIAGNOSIS — Z30431 Encounter for routine checking of intrauterine contraceptive device: Secondary | ICD-10-CM

## 2022-11-02 ENCOUNTER — Ambulatory Visit: Primary: Diagnostic Radiology
